# Patient Record
Sex: Male | Born: 1990 | Race: White | Hispanic: No | Marital: Single | Smoking: Never smoker
Health system: Southern US, Community
[De-identification: ages and names within clinical notes are randomized; demographics above are authoritative.]

---

## 2017-09-24 ENCOUNTER — Emergency Department (HOSPITAL_COMMUNITY)
Admission: EM | Admit: 2017-09-24 | Discharge: 2017-09-24 | Disposition: A | Discharge status: Home / Self Care | Payer: Commercial Managed Care - PPO | Attending: Emergency Medicine | Admitting: Emergency Medicine

## 2017-09-24 ENCOUNTER — Encounter (HOSPITAL_COMMUNITY): Payer: Self-pay

## 2017-09-24 DIAGNOSIS — R05 Cough: Secondary | ICD-10-CM | POA: Insufficient documentation

## 2017-09-24 DIAGNOSIS — R197 Diarrhea, unspecified: Secondary | ICD-10-CM | POA: Insufficient documentation

## 2017-09-24 DIAGNOSIS — R112 Nausea with vomiting, unspecified: Secondary | ICD-10-CM

## 2017-09-24 DIAGNOSIS — M7918 Myalgia, other site: Secondary | ICD-10-CM | POA: Diagnosis not present

## 2017-09-24 DIAGNOSIS — R509 Fever, unspecified: Secondary | ICD-10-CM | POA: Diagnosis not present

## 2017-09-24 DIAGNOSIS — R51 Headache: Secondary | ICD-10-CM | POA: Insufficient documentation

## 2017-09-24 LAB — COMPREHENSIVE METABOLIC PANEL
ALBUMIN: 4.5 g/dL (ref 3.5–5.0)
ALT: 45 U/L (ref 17–63)
ANION GAP: 14 (ref 5–15)
AST: 52 U/L — AB (ref 15–41)
Alkaline Phosphatase: 54 U/L (ref 38–126)
BILIRUBIN TOTAL: 1.1 mg/dL (ref 0.3–1.2)
BUN: 17 mg/dL (ref 6–20)
CHLORIDE: 104 mmol/L (ref 101–111)
CO2: 22 mmol/L (ref 22–32)
Calcium: 9.8 mg/dL (ref 8.9–10.3)
Creatinine, Ser: 1.18 mg/dL (ref 0.61–1.24)
GFR calc Af Amer: >60 mL/min (ref >60)
GFR calc non Af Amer: >60 mL/min (ref >60)
GLUCOSE: 108 mg/dL — AB (ref 65–99)
POTASSIUM: 3.7 mmol/L (ref 3.5–5.1)
SODIUM: 140 mmol/L (ref 135–145)
TOTAL PROTEIN: 7.2 g/dL (ref 6.5–8.1)

## 2017-09-24 LAB — LIPASE, BLOOD: Lipase: 22 U/L (ref 11–51)

## 2017-09-24 LAB — URINALYSIS, ROUTINE W REFLEX MICROSCOPIC
BILIRUBIN URINE: NEGATIVE
Bacteria, UA: NONE SEEN
Glucose, UA: NEGATIVE mg/dL
Hgb urine dipstick: NEGATIVE
KETONES UR: 5 mg/dL — AB
NITRITE: NEGATIVE
Protein, ur: NEGATIVE mg/dL
SPECIFIC GRAVITY, URINE: 1.015 (ref 1.005–1.030)
SQUAMOUS EPITHELIAL / LPF: NONE SEEN
pH: 9 — ABNORMAL HIGH (ref 5.0–8.0)

## 2017-09-24 LAB — CBC
HEMATOCRIT: 41.7 % (ref 39.0–52.0)
HEMOGLOBIN: 14.6 g/dL (ref 13.0–17.0)
MCH: 30.7 pg (ref 26.0–34.0)
MCHC: 35 g/dL (ref 30.0–36.0)
MCV: 87.8 fL (ref 78.0–100.0)
Platelets: 204 10*3/uL (ref 150–400)
RBC: 4.75 MIL/uL (ref 4.22–5.81)
RDW: 12.7 % (ref 11.5–15.5)
WBC: 10.6 10*3/uL — ABNORMAL HIGH (ref 4.0–10.5)

## 2017-09-24 MED ORDER — METOCLOPRAMIDE HCL 10 MG PO TABS: 10.0000 mg | ORAL_TABLET | Freq: Four times a day (QID) | ORAL | 0 refills | Status: AC | PRN

## 2017-09-24 MED ORDER — KETOROLAC TROMETHAMINE 30 MG/ML IJ SOLN
30.0000 mg | Freq: Once | INTRAMUSCULAR | Status: AC
  Administered 2017-09-24: 30 mg via INTRAVENOUS
  Filled 2017-09-24: qty 1

## 2017-09-24 MED ORDER — DIPHENHYDRAMINE HCL 50 MG/ML IJ SOLN
25.0000 mg | Freq: Once | INTRAMUSCULAR | Status: AC
  Administered 2017-09-24: 25 mg via INTRAVENOUS
  Filled 2017-09-24: qty 1

## 2017-09-24 MED ORDER — SODIUM CHLORIDE 0.9 % IV BOLUS (SEPSIS)
1000.0000 mL | Freq: Once | INTRAVENOUS | Status: AC
  Administered 2017-09-24: 1000 mL via INTRAVENOUS

## 2017-09-24 MED ORDER — METOCLOPRAMIDE HCL 5 MG/ML IJ SOLN
10.0000 mg | Freq: Once | INTRAMUSCULAR | Status: AC
  Administered 2017-09-24: 10 mg via INTRAVENOUS
  Filled 2017-09-24: qty 2

## 2017-09-24 MED ORDER — ONDANSETRON HCL 4 MG/2ML IJ SOLN
4.0000 mg | Freq: Once | INTRAMUSCULAR | Status: AC | PRN
  Administered 2017-09-24: 4 mg via INTRAVENOUS
  Filled 2017-09-24: qty 2

## 2017-09-24 NOTE — Discharge Instructions (Signed)
Take loperamide (Imodium A-D) as needed for diarrhea. ?

## 2017-09-24 NOTE — ED Notes (Signed)
Bed: WTR6 Expected date:  Expected time:  Means of arrival:  Comments: 

## 2017-09-24 NOTE — ED Provider Notes (Signed)
[facility]-EMERGENCY DEPT Provider Note   CSN: [id number] Arrival date & time: 09/24/17  0334     History   Chief Complaint Chief Complaint  Patient presents with  . Emesis    HPI Edward Gray is a 27 y.o. male.  The history is provided by the patient.  Emesis    He started getting sick for 5 days ago with nasal stuffiness and cough.  Last night, he started having chills and vomiting and diarrhea.  He is complaining of some generalized body aches.  There was no known fever.  He is also complaining of a headache.  He was brought in by ambulance and has received some fluid and received ondansetron in the ED.  He still complains of some nausea, but he no longer has a sense of having more diarrhea.  There have been no known sick contacts.  He did not receive influenza immunization this year.  He was concerned because he was doing a juice cleansing and wanted to make sure he was not having a reaction to that.  History reviewed. No pertinent past medical history.  There are no active problems to display for this patient.   History reviewed. No pertinent surgical history.     Home Medications    Prior to Admission medications   Not on File    Family History History reviewed. No pertinent family history.  Social History Social History   Tobacco Use  . Smoking status: Never Smoker  . Smokeless tobacco: Never Used  Substance Use Topics  . Alcohol use: No    Frequency: Never  . Drug use: No     Allergies   Patient has no allergy information on record.   Review of Systems Review of Systems  Gastrointestinal: Positive for vomiting.  All other systems reviewed and are negative.    Physical Exam Updated Vital Signs BP (!) 118/91 (BP Location: Right Arm)   Pulse (!) 107   Temp 99 F (37.2 C) (Oral)   Resp (!) 22   SpO2 100%   Physical Exam  Nursing note and vitals reviewed.  27 year old male, resting comfortably and in no acute  distress. Vital signs are significant for elevated heart rate and respiratory rate. Oxygen saturation is 100%, which is normal. Head is normocephalic and atraumatic. PERRLA, EOMI. Oropharynx is clear. Neck is nontender and supple without adenopathy or JVD. Back is nontender and there is no CVA tenderness. Lungs are clear without rales, wheezes, or rhonchi. Chest is nontender. Heart has regular rate and rhythm without murmur. Abdomen is soft, flat, nontender without masses or hepatosplenomegaly and peristalsis is hypoactive. Extremities have no cyanosis or edema, full range of motion is present. Skin is warm and dry without rash. Neurologic: Mental status is normal, cranial nerves are intact, there are no motor or sensory deficits.  ED Treatments / Results  Labs (all labs ordered are listed, but only abnormal results are displayed) Labs Reviewed  COMPREHENSIVE METABOLIC PANEL - Abnormal; Notable for the following components:      Result Value   Glucose, Bld 108 (*)    AST 52 (*)    All other components within normal limits  CBC - Abnormal; Notable for the following components:   WBC 10.6 (*)    All other components within normal limits  LIPASE, BLOOD  URINALYSIS, ROUTINE W REFLEX MICROSCOPIC   Procedures Procedures (including critical care time)  Medications Ordered in ED Medications  metoCLOPramide (REGLAN) injection 10 mg (not  administered)  diphenhydrAMINE (BENADRYL) injection 25 mg (not administered)  sodium chloride 0.9 % bolus 1,000 mL (not administered)  ondansetron (ZOFRAN) injection 4 mg (4 mg Intravenous Given 09/24/17 0402)     Initial Impression / Assessment and Plan / ED Course  I have reviewed the triage vital signs and the nursing notes.  Pertinent lab results that were available during my care of the patient were reviewed by me and considered in my medical decision making (see chart for details).  Nausea, vomiting, diarrhea in pattern strongly suggestive of  viral gastroenteritis.  No red flags to suggest more serious illness.  Illness started several days earlier with respiratory complaints.  It is possible that this is influenza.  However, if it is influenza, and he is outside the treatment window for antiviral medication.  Laboratory workup shows minor elevation of AST which is not felt to be clinically significant.  He will be given additional IV fluids and is given metoclopramide for nausea and ketorolac for aching.  He has no prior records in the [facility].  He feels much better after above-noted treatment.  He is discharged with prescription for ketorolac, advised use over-the-counter loperamide if diarrhea recurs.  Final Clinical Impressions(s) / ED Diagnoses   Final diagnoses:  Nausea vomiting and diarrhea    ED Discharge Orders        Ordered    metoCLOPramide (REGLAN) 10 MG tablet  Every 6 hours PRN     09/24/17 [id number]       Dione BoozeGlick, Jeaninne Lodico, MD 09/24/17 [id number]

## 2017-09-24 NOTE — ED Notes (Signed)
Pt states that he feels much better °

## 2017-12-02 ENCOUNTER — Other Ambulatory Visit: Payer: Self-pay | Admitting: Gastroenterology

## 2017-12-02 DIAGNOSIS — R7989 Other specified abnormal findings of blood chemistry: Secondary | ICD-10-CM

## 2017-12-02 DIAGNOSIS — R945 Abnormal results of liver function studies: Principal | ICD-10-CM

## 2017-12-17 ENCOUNTER — Ambulatory Visit
Admission: RE | Admit: 2017-12-17 | Discharge: 2017-12-17 | Disposition: A | Discharge status: Home / Self Care | Payer: Commercial Managed Care - PPO | Source: Ambulatory Visit | Attending: Gastroenterology | Admitting: Gastroenterology

## 2017-12-26 ENCOUNTER — Other Ambulatory Visit: Payer: Self-pay | Admitting: Gastroenterology

## 2017-12-26 DIAGNOSIS — R1033 Periumbilical pain: Secondary | ICD-10-CM

## 2017-12-26 DIAGNOSIS — K529 Noninfective gastroenteritis and colitis, unspecified: Secondary | ICD-10-CM

## 2019-12-04 IMAGING — US US ABDOMEN LIMITED
1 series · 14 of 25 positions shown · non-contrast
Comparison: None.

CLINICAL DATA: Elevated liver enzymes.  Crohn's disease

EXAM:
ULTRASOUND ABDOMEN LIMITED RIGHT UPPER QUADRANT

[Series 1: us abdomen limited · 0.23mm/px · 14 of 39 slices shown]
[im 1/39]
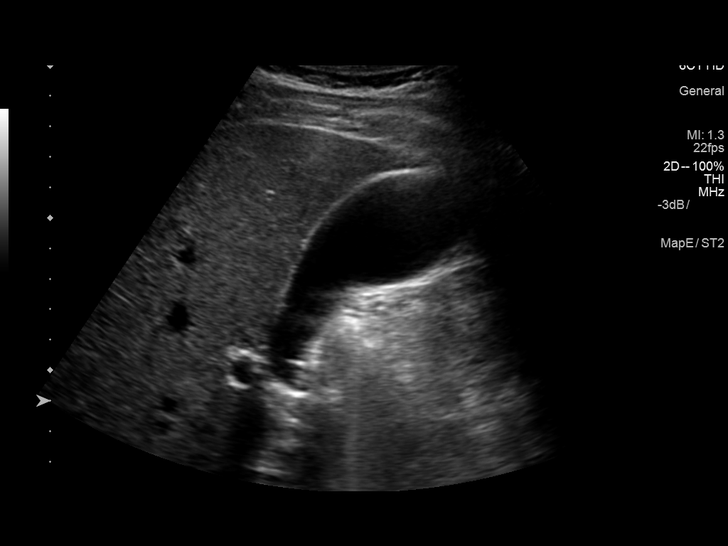
[im 4/39]
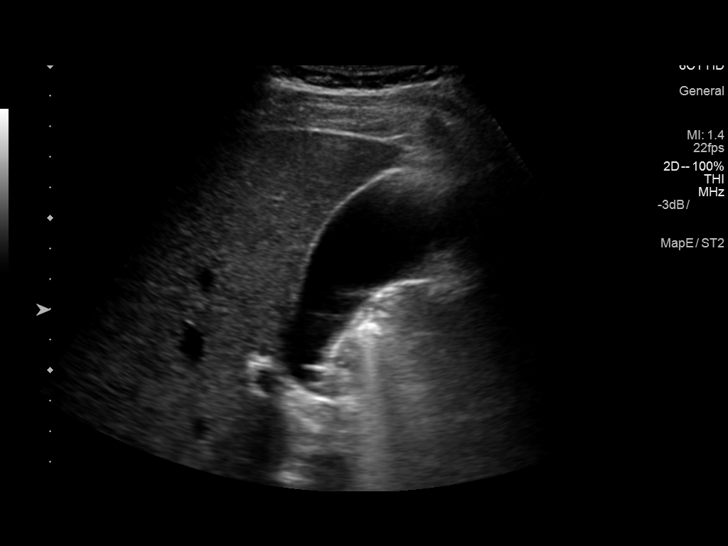
[im 7/39]
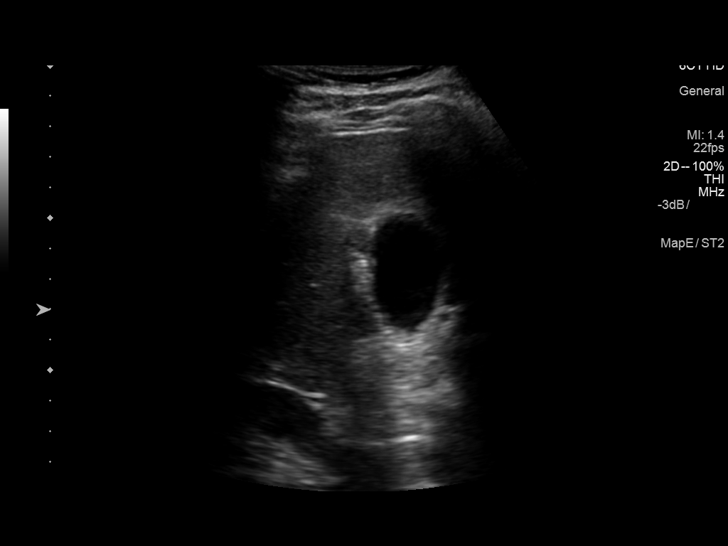
[im 10/39]
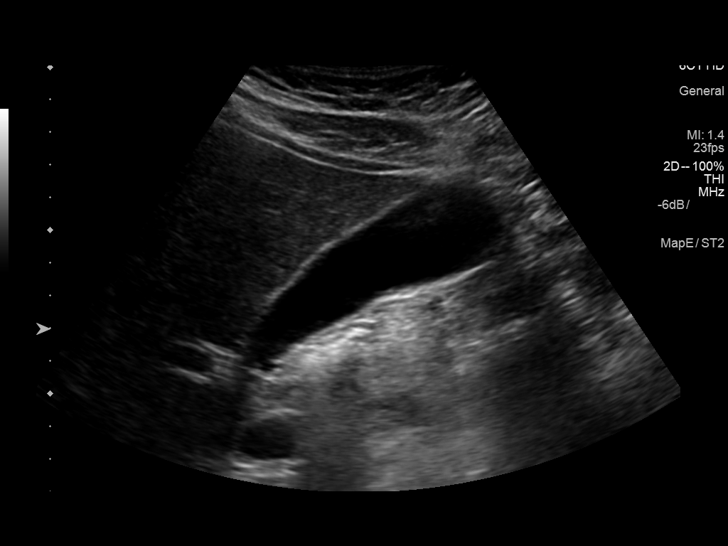
[im 13/39]
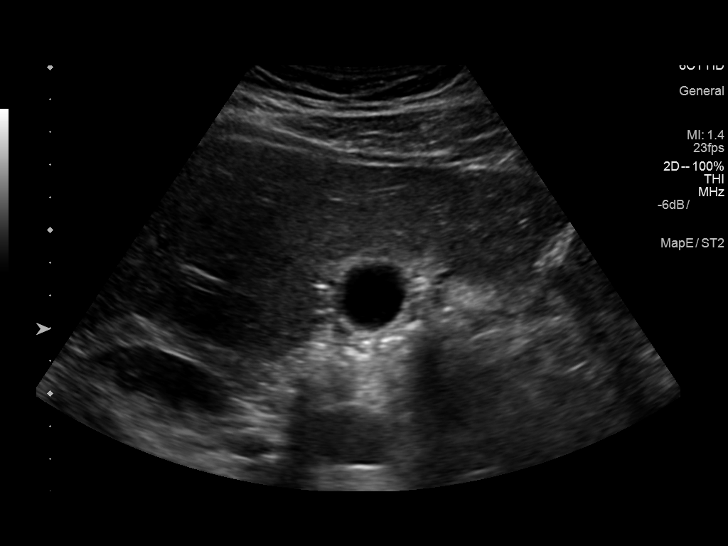
[im 15/39]
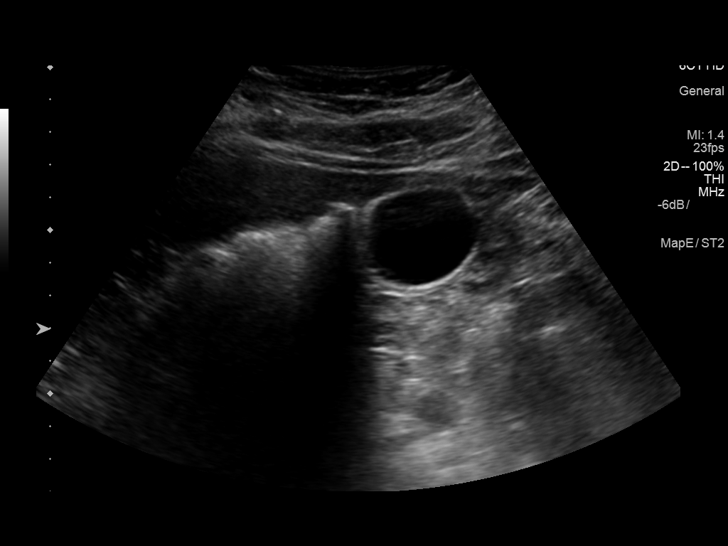
[im 18/39]
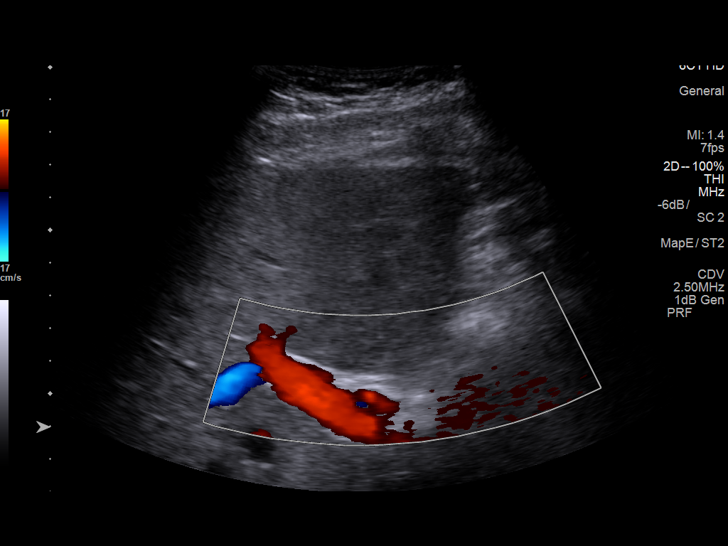
[im 21/39]
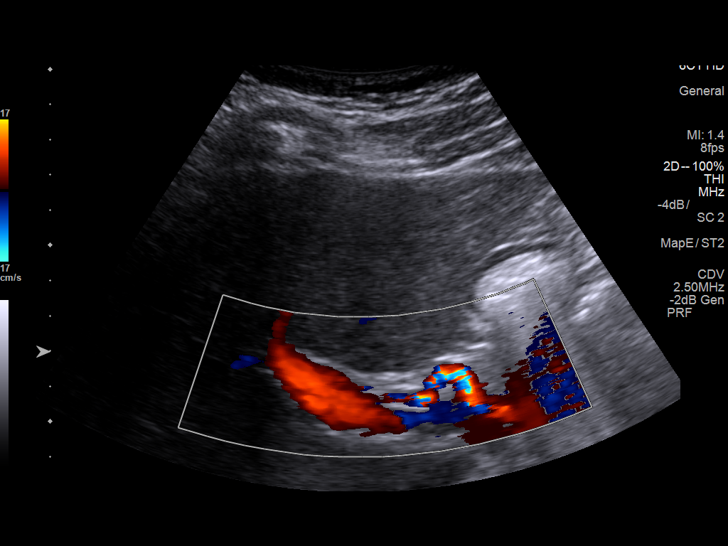
[im 24/39]
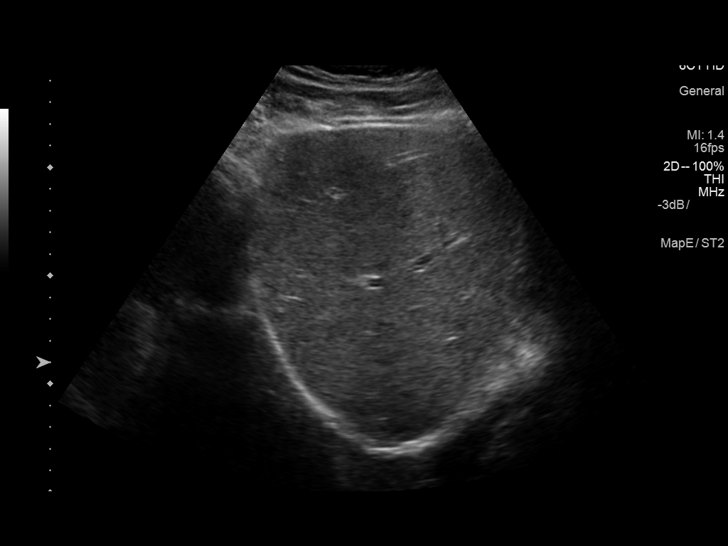
[im 26/39]
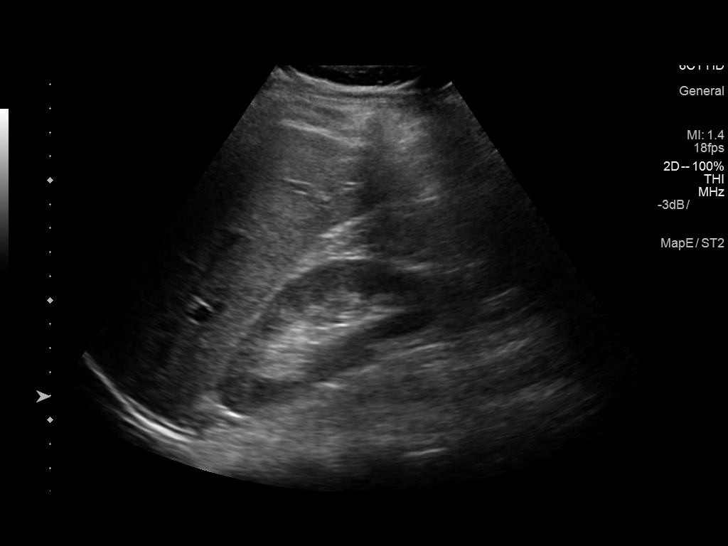
[im 29/39]
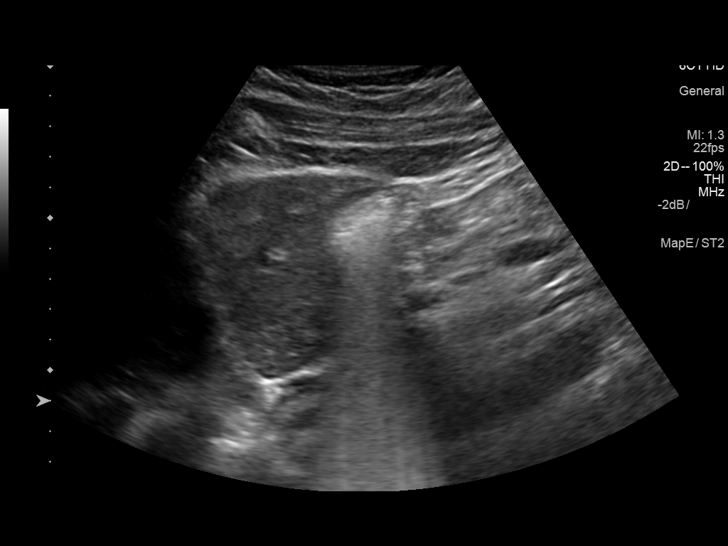
[im 32/39]
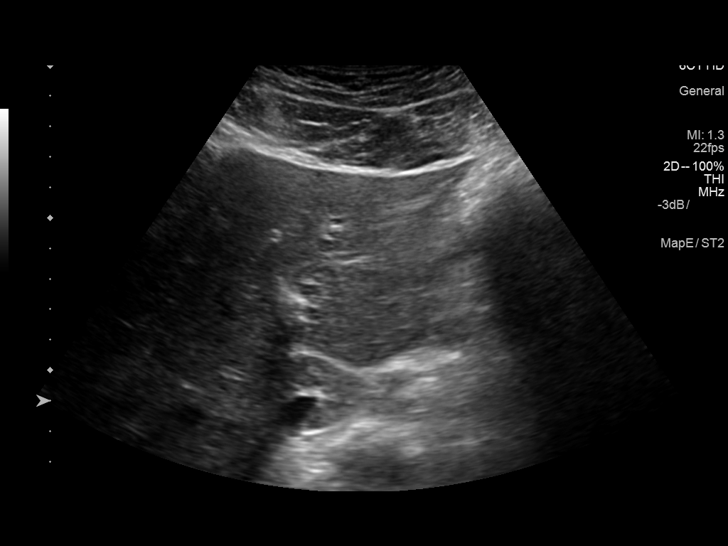
[im 35/39]
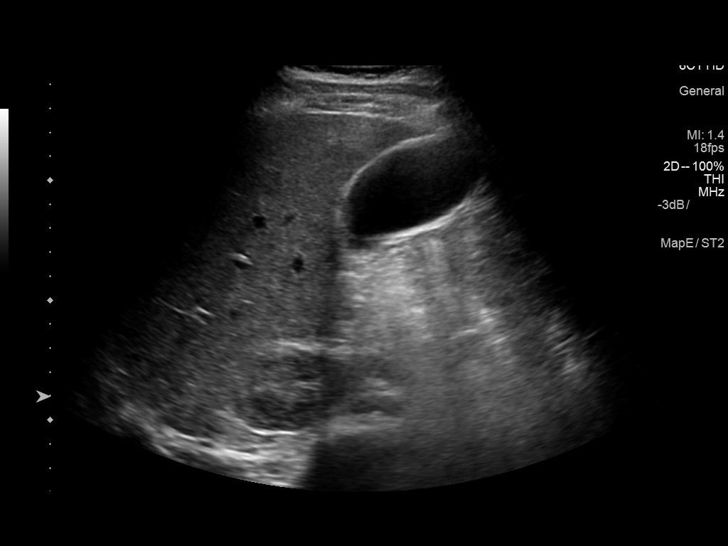
[im 39/39]
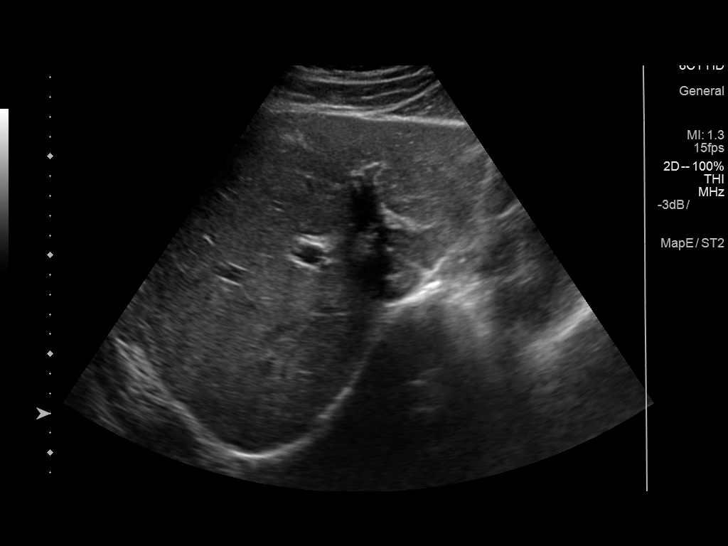

[14 of 25 positions shown; findings below may reference images not displayed]

FINDINGS: Gallbladder:

No gallstones or wall thickening visualized. No pericholecystic
fluid. No sonographic Murphy sign noted by sonographer.

Common bile duct:

Diameter: 3 mm. No intrahepatic or extrahepatic biliary duct
dilatation.

Liver:

No focal lesion identified. Within normal limits in parenchymal
echogenicity. Portal vein is patent on color Doppler imaging with
normal direction of blood flow towards the liver.
IMPRESSION: Study within normal limits.
